# Patient Record
Sex: Female | Born: 1965 | Race: White | Hispanic: No | Marital: Married
Health system: Southern US, Community
[De-identification: ages and names within clinical notes are randomized; demographics above are authoritative.]

---

## 1998-02-19 ENCOUNTER — Inpatient Hospital Stay (HOSPITAL_COMMUNITY): Admission: AD | Admit: 1998-02-19 | Discharge: 1998-02-21 | Payer: Self-pay | Admitting: Obstetrics and Gynecology

## 2001-05-10 ENCOUNTER — Other Ambulatory Visit: Admission: RE | Admit: 2001-05-10 | Discharge: 2001-05-10 | Payer: Self-pay | Admitting: Obstetrics and Gynecology

## 2002-05-14 ENCOUNTER — Other Ambulatory Visit: Admission: RE | Admit: 2002-05-14 | Discharge: 2002-05-14 | Payer: Self-pay | Admitting: Obstetrics and Gynecology

## 2003-08-18 ENCOUNTER — Other Ambulatory Visit: Admission: RE | Admit: 2003-08-18 | Discharge: 2003-08-18 | Payer: Self-pay | Admitting: Obstetrics and Gynecology

## 2007-08-21 ENCOUNTER — Encounter: Admission: RE | Admit: 2007-08-21 | Discharge: 2007-08-21 | Payer: Self-pay | Admitting: Obstetrics and Gynecology

## 2008-09-16 ENCOUNTER — Encounter: Admission: RE | Admit: 2008-09-16 | Discharge: 2008-09-16 | Payer: Self-pay | Admitting: Obstetrics and Gynecology

## 2008-09-26 ENCOUNTER — Encounter: Admission: RE | Admit: 2008-09-26 | Discharge: 2008-09-26 | Payer: Self-pay | Admitting: Obstetrics and Gynecology

## 2009-11-27 ENCOUNTER — Encounter: Admission: RE | Admit: 2009-11-27 | Discharge: 2009-11-27 | Payer: Self-pay | Admitting: Obstetrics and Gynecology

## 2010-11-29 ENCOUNTER — Encounter
Admission: RE | Admit: 2010-11-29 | Discharge: 2010-11-29 | Payer: Self-pay | Source: Home / Self Care | Attending: Obstetrics and Gynecology | Admitting: Obstetrics and Gynecology

## 2010-12-05 ENCOUNTER — Encounter: Payer: Self-pay | Admitting: Obstetrics and Gynecology

## 2011-11-23 ENCOUNTER — Other Ambulatory Visit: Payer: Self-pay | Admitting: Obstetrics and Gynecology

## 2011-11-23 DIAGNOSIS — Z1231 Encounter for screening mammogram for malignant neoplasm of breast: Secondary | ICD-10-CM

## 2011-11-29 ENCOUNTER — Ambulatory Visit
Admission: RE | Admit: 2011-11-29 | Discharge: 2011-11-29 | Disposition: A | Discharge status: Home / Self Care | Payer: BC Managed Care – PPO | Source: Ambulatory Visit | Attending: Obstetrics and Gynecology | Admitting: Obstetrics and Gynecology

## 2011-11-29 DIAGNOSIS — Z1231 Encounter for screening mammogram for malignant neoplasm of breast: Secondary | ICD-10-CM

## 2012-10-31 ENCOUNTER — Other Ambulatory Visit: Payer: Self-pay | Admitting: Obstetrics and Gynecology

## 2012-10-31 DIAGNOSIS — Z1231 Encounter for screening mammogram for malignant neoplasm of breast: Secondary | ICD-10-CM

## 2012-11-29 ENCOUNTER — Ambulatory Visit
Admission: RE | Admit: 2012-11-29 | Discharge: 2012-11-29 | Disposition: A | Discharge status: Home / Self Care | Payer: BC Managed Care – PPO | Source: Ambulatory Visit | Attending: Obstetrics and Gynecology | Admitting: Obstetrics and Gynecology

## 2012-11-29 DIAGNOSIS — Z1231 Encounter for screening mammogram for malignant neoplasm of breast: Secondary | ICD-10-CM

## 2013-11-26 ENCOUNTER — Other Ambulatory Visit: Payer: Self-pay

## 2013-11-26 DIAGNOSIS — Z1231 Encounter for screening mammogram for malignant neoplasm of breast: Secondary | ICD-10-CM

## 2014-01-10 ENCOUNTER — Ambulatory Visit: Payer: BC Managed Care – PPO

## 2014-01-16 ENCOUNTER — Ambulatory Visit
Admission: RE | Admit: 2014-01-16 | Discharge: 2014-01-16 | Disposition: A | Discharge status: Home / Self Care | Payer: BC Managed Care – PPO | Source: Ambulatory Visit

## 2014-01-16 DIAGNOSIS — Z1231 Encounter for screening mammogram for malignant neoplasm of breast: Secondary | ICD-10-CM

## 2014-01-20 ENCOUNTER — Other Ambulatory Visit: Payer: Self-pay | Admitting: Obstetrics and Gynecology

## 2014-01-20 DIAGNOSIS — R928 Other abnormal and inconclusive findings on diagnostic imaging of breast: Secondary | ICD-10-CM

## 2014-01-31 ENCOUNTER — Ambulatory Visit
Admission: RE | Admit: 2014-01-31 | Discharge: 2014-01-31 | Disposition: A | Discharge status: Home / Self Care | Payer: BC Managed Care – PPO | Source: Ambulatory Visit | Attending: Obstetrics and Gynecology | Admitting: Obstetrics and Gynecology

## 2014-01-31 ENCOUNTER — Ambulatory Visit
Admission: RE | Admit: 2014-01-31 | Discharge: 2014-01-31 | Disposition: A | Discharge status: Home / Self Care | Payer: Self-pay | Source: Ambulatory Visit | Attending: Obstetrics and Gynecology | Admitting: Obstetrics and Gynecology

## 2014-01-31 DIAGNOSIS — R928 Other abnormal and inconclusive findings on diagnostic imaging of breast: Secondary | ICD-10-CM

## 2014-12-31 ENCOUNTER — Other Ambulatory Visit: Payer: Self-pay

## 2014-12-31 DIAGNOSIS — Z1231 Encounter for screening mammogram for malignant neoplasm of breast: Secondary | ICD-10-CM

## 2015-02-09 ENCOUNTER — Ambulatory Visit: Payer: Self-pay

## 2015-02-18 ENCOUNTER — Other Ambulatory Visit: Payer: Self-pay | Admitting: Family Medicine

## 2015-02-18 ENCOUNTER — Ambulatory Visit
Admission: RE | Admit: 2015-02-18 | Discharge: 2015-02-18 | Disposition: A | Discharge status: Home / Self Care | Payer: BLUE CROSS/BLUE SHIELD | Source: Ambulatory Visit | Attending: Family Medicine | Admitting: Family Medicine

## 2015-02-18 DIAGNOSIS — R05 Cough: Secondary | ICD-10-CM

## 2015-02-18 DIAGNOSIS — R059 Cough, unspecified: Secondary | ICD-10-CM

## 2015-04-20 ENCOUNTER — Ambulatory Visit
Admission: RE | Admit: 2015-04-20 | Discharge: 2015-04-20 | Disposition: A | Discharge status: Home / Self Care | Payer: BLUE CROSS/BLUE SHIELD | Source: Ambulatory Visit

## 2015-04-20 ENCOUNTER — Encounter (INDEPENDENT_AMBULATORY_CARE_PROVIDER_SITE_OTHER): Payer: Self-pay

## 2015-04-20 DIAGNOSIS — Z1231 Encounter for screening mammogram for malignant neoplasm of breast: Secondary | ICD-10-CM

## 2016-03-17 ENCOUNTER — Other Ambulatory Visit: Payer: Self-pay

## 2016-03-17 DIAGNOSIS — Z1231 Encounter for screening mammogram for malignant neoplasm of breast: Secondary | ICD-10-CM

## 2016-04-22 ENCOUNTER — Ambulatory Visit: Payer: BLUE CROSS/BLUE SHIELD

## 2016-05-09 ENCOUNTER — Ambulatory Visit
Admission: RE | Admit: 2016-05-09 | Discharge: 2016-05-09 | Disposition: A | Discharge status: Home / Self Care | Payer: BLUE CROSS/BLUE SHIELD | Source: Ambulatory Visit

## 2016-05-09 DIAGNOSIS — Z1231 Encounter for screening mammogram for malignant neoplasm of breast: Secondary | ICD-10-CM

## 2016-07-27 DIAGNOSIS — Z6822 Body mass index (BMI) 22.0-22.9, adult: Secondary | ICD-10-CM | POA: Diagnosis not present

## 2016-07-27 DIAGNOSIS — Z01419 Encounter for gynecological examination (general) (routine) without abnormal findings: Secondary | ICD-10-CM | POA: Diagnosis not present

## 2017-03-15 DIAGNOSIS — F4324 Adjustment disorder with disturbance of conduct: Secondary | ICD-10-CM | POA: Diagnosis not present

## 2017-03-24 DIAGNOSIS — F4324 Adjustment disorder with disturbance of conduct: Secondary | ICD-10-CM | POA: Diagnosis not present

## 2017-04-19 ENCOUNTER — Other Ambulatory Visit: Payer: Self-pay | Admitting: Obstetrics and Gynecology

## 2017-04-19 DIAGNOSIS — Z1231 Encounter for screening mammogram for malignant neoplasm of breast: Secondary | ICD-10-CM

## 2017-04-21 DIAGNOSIS — L237 Allergic contact dermatitis due to plants, except food: Secondary | ICD-10-CM | POA: Diagnosis not present

## 2017-04-21 DIAGNOSIS — L03113 Cellulitis of right upper limb: Secondary | ICD-10-CM | POA: Diagnosis not present

## 2017-05-11 ENCOUNTER — Ambulatory Visit
Admission: RE | Admit: 2017-05-11 | Discharge: 2017-05-11 | Disposition: A | Discharge status: Home / Self Care | Payer: BLUE CROSS/BLUE SHIELD | Source: Ambulatory Visit | Attending: Obstetrics and Gynecology | Admitting: Obstetrics and Gynecology

## 2017-05-11 DIAGNOSIS — Z1231 Encounter for screening mammogram for malignant neoplasm of breast: Secondary | ICD-10-CM

## 2017-05-22 DIAGNOSIS — F4323 Adjustment disorder with mixed anxiety and depressed mood: Secondary | ICD-10-CM | POA: Diagnosis not present

## 2017-05-29 DIAGNOSIS — F4323 Adjustment disorder with mixed anxiety and depressed mood: Secondary | ICD-10-CM | POA: Diagnosis not present

## 2017-06-14 DIAGNOSIS — F4323 Adjustment disorder with mixed anxiety and depressed mood: Secondary | ICD-10-CM | POA: Diagnosis not present

## 2017-07-19 DIAGNOSIS — F4323 Adjustment disorder with mixed anxiety and depressed mood: Secondary | ICD-10-CM | POA: Diagnosis not present

## 2017-07-24 DIAGNOSIS — Z86018 Personal history of other benign neoplasm: Secondary | ICD-10-CM | POA: Diagnosis not present

## 2017-07-24 DIAGNOSIS — D225 Melanocytic nevi of trunk: Secondary | ICD-10-CM | POA: Diagnosis not present

## 2017-07-24 DIAGNOSIS — L814 Other melanin hyperpigmentation: Secondary | ICD-10-CM | POA: Diagnosis not present

## 2017-07-24 DIAGNOSIS — D485 Neoplasm of uncertain behavior of skin: Secondary | ICD-10-CM | POA: Diagnosis not present

## 2017-07-24 DIAGNOSIS — D1800 Hemangioma unspecified site: Secondary | ICD-10-CM | POA: Diagnosis not present

## 2017-10-18 DIAGNOSIS — Z6823 Body mass index (BMI) 23.0-23.9, adult: Secondary | ICD-10-CM | POA: Diagnosis not present

## 2017-10-18 DIAGNOSIS — Z01419 Encounter for gynecological examination (general) (routine) without abnormal findings: Secondary | ICD-10-CM | POA: Diagnosis not present

## 2017-10-31 DIAGNOSIS — Z1159 Encounter for screening for other viral diseases: Secondary | ICD-10-CM | POA: Diagnosis not present

## 2017-10-31 DIAGNOSIS — Z113 Encounter for screening for infections with a predominantly sexual mode of transmission: Secondary | ICD-10-CM | POA: Diagnosis not present

## 2017-10-31 DIAGNOSIS — Z118 Encounter for screening for other infectious and parasitic diseases: Secondary | ICD-10-CM | POA: Diagnosis not present

## 2017-10-31 DIAGNOSIS — R87612 Low grade squamous intraepithelial lesion on cytologic smear of cervix (LGSIL): Secondary | ICD-10-CM | POA: Diagnosis not present

## 2017-10-31 DIAGNOSIS — Z114 Encounter for screening for human immunodeficiency virus [HIV]: Secondary | ICD-10-CM | POA: Diagnosis not present

## 2017-11-29 DIAGNOSIS — F4322 Adjustment disorder with anxiety: Secondary | ICD-10-CM | POA: Diagnosis not present

## 2017-12-27 DIAGNOSIS — F4322 Adjustment disorder with anxiety: Secondary | ICD-10-CM | POA: Diagnosis not present

## 2018-02-02 DIAGNOSIS — F4322 Adjustment disorder with anxiety: Secondary | ICD-10-CM | POA: Diagnosis not present

## 2018-04-08 DIAGNOSIS — L237 Allergic contact dermatitis due to plants, except food: Secondary | ICD-10-CM | POA: Diagnosis not present

## 2018-04-16 ENCOUNTER — Other Ambulatory Visit: Payer: Self-pay | Admitting: Obstetrics and Gynecology

## 2018-04-16 DIAGNOSIS — Z1231 Encounter for screening mammogram for malignant neoplasm of breast: Secondary | ICD-10-CM

## 2018-04-30 DIAGNOSIS — Z0000 Encounter for general adult medical examination without abnormal findings: Secondary | ICD-10-CM | POA: Diagnosis not present

## 2018-04-30 DIAGNOSIS — Z23 Encounter for immunization: Secondary | ICD-10-CM | POA: Diagnosis not present

## 2018-04-30 DIAGNOSIS — Z136 Encounter for screening for cardiovascular disorders: Secondary | ICD-10-CM | POA: Diagnosis not present

## 2018-05-16 ENCOUNTER — Ambulatory Visit: Payer: BLUE CROSS/BLUE SHIELD

## 2018-06-21 ENCOUNTER — Ambulatory Visit
Admission: RE | Admit: 2018-06-21 | Discharge: 2018-06-21 | Disposition: A | Discharge status: Home / Self Care | Payer: BLUE CROSS/BLUE SHIELD | Source: Ambulatory Visit | Attending: Obstetrics and Gynecology | Admitting: Obstetrics and Gynecology

## 2018-06-21 DIAGNOSIS — Z1231 Encounter for screening mammogram for malignant neoplasm of breast: Secondary | ICD-10-CM | POA: Diagnosis not present

## 2018-06-27 DIAGNOSIS — K635 Polyp of colon: Secondary | ICD-10-CM | POA: Diagnosis not present

## 2018-06-27 DIAGNOSIS — Z1211 Encounter for screening for malignant neoplasm of colon: Secondary | ICD-10-CM | POA: Diagnosis not present

## 2018-06-27 DIAGNOSIS — K5289 Other specified noninfective gastroenteritis and colitis: Secondary | ICD-10-CM | POA: Diagnosis not present

## 2018-07-03 DIAGNOSIS — Z1211 Encounter for screening for malignant neoplasm of colon: Secondary | ICD-10-CM | POA: Diagnosis not present

## 2018-07-03 DIAGNOSIS — K635 Polyp of colon: Secondary | ICD-10-CM | POA: Diagnosis not present

## 2018-07-03 DIAGNOSIS — K5289 Other specified noninfective gastroenteritis and colitis: Secondary | ICD-10-CM | POA: Diagnosis not present

## 2018-07-25 DIAGNOSIS — D1800 Hemangioma unspecified site: Secondary | ICD-10-CM | POA: Diagnosis not present

## 2018-07-25 DIAGNOSIS — L821 Other seborrheic keratosis: Secondary | ICD-10-CM | POA: Diagnosis not present

## 2018-07-25 DIAGNOSIS — Z86018 Personal history of other benign neoplasm: Secondary | ICD-10-CM | POA: Diagnosis not present

## 2018-07-25 DIAGNOSIS — L814 Other melanin hyperpigmentation: Secondary | ICD-10-CM | POA: Diagnosis not present

## 2018-09-12 DIAGNOSIS — B078 Other viral warts: Secondary | ICD-10-CM | POA: Diagnosis not present

## 2018-12-26 DIAGNOSIS — B079 Viral wart, unspecified: Secondary | ICD-10-CM | POA: Diagnosis not present

## 2018-12-26 DIAGNOSIS — L821 Other seborrheic keratosis: Secondary | ICD-10-CM | POA: Diagnosis not present

## 2018-12-26 DIAGNOSIS — D485 Neoplasm of uncertain behavior of skin: Secondary | ICD-10-CM | POA: Diagnosis not present

## 2019-01-16 DIAGNOSIS — Z124 Encounter for screening for malignant neoplasm of cervix: Secondary | ICD-10-CM | POA: Diagnosis not present

## 2019-01-16 DIAGNOSIS — Z01419 Encounter for gynecological examination (general) (routine) without abnormal findings: Secondary | ICD-10-CM | POA: Diagnosis not present

## 2019-01-16 DIAGNOSIS — Z113 Encounter for screening for infections with a predominantly sexual mode of transmission: Secondary | ICD-10-CM | POA: Diagnosis not present

## 2019-01-16 DIAGNOSIS — Z1151 Encounter for screening for human papillomavirus (HPV): Secondary | ICD-10-CM | POA: Diagnosis not present

## 2019-01-16 DIAGNOSIS — Z6823 Body mass index (BMI) 23.0-23.9, adult: Secondary | ICD-10-CM | POA: Diagnosis not present

## 2019-01-16 DIAGNOSIS — R87810 Cervical high risk human papillomavirus (HPV) DNA test positive: Secondary | ICD-10-CM | POA: Diagnosis not present

## 2019-05-15 ENCOUNTER — Other Ambulatory Visit: Payer: Self-pay | Admitting: Obstetrics and Gynecology

## 2019-05-15 DIAGNOSIS — Z1231 Encounter for screening mammogram for malignant neoplasm of breast: Secondary | ICD-10-CM

## 2019-06-25 ENCOUNTER — Ambulatory Visit
Admission: RE | Admit: 2019-06-25 | Discharge: 2019-06-25 | Disposition: A | Discharge status: Home / Self Care | Payer: BLUE CROSS/BLUE SHIELD | Source: Ambulatory Visit | Attending: Obstetrics and Gynecology | Admitting: Obstetrics and Gynecology

## 2019-06-25 ENCOUNTER — Other Ambulatory Visit: Payer: Self-pay

## 2019-06-25 DIAGNOSIS — Z1231 Encounter for screening mammogram for malignant neoplasm of breast: Secondary | ICD-10-CM

## 2020-01-09 ENCOUNTER — Ambulatory Visit: Payer: Self-pay | Attending: Internal Medicine

## 2020-01-09 DIAGNOSIS — Z23 Encounter for immunization: Secondary | ICD-10-CM | POA: Insufficient documentation

## 2020-01-09 NOTE — Progress Notes (Signed)
   Covid-19 Vaccination Clinic  Name:  Alexandra Rivera    MRN: [id number] DOB: 11-30-1965  01/09/2020  [name] was observed post Covid-19 immunization for 15 minutes without incidence. She was provided with Vaccine Information Sheet and instruction to access the V-Safe system.   [name] was instructed to call 911 with any severe reactions post vaccine: Marland Kitchen Difficulty breathing  . Swelling of your face and throat  . A fast heartbeat  . A bad rash all over your body  . Dizziness and weakness    Immunizations Administered    Name Date Dose VIS Date Route   Pfizer COVID-19 Vaccine 01/09/2020 11:30 AM 0.3 mL 10/25/2019 Intramuscular   Manufacturer: [employer], [employer]   Lot: [id number]   NDC: [address]-8

## 2020-01-29 ENCOUNTER — Ambulatory Visit: Payer: Self-pay

## 2020-02-04 ENCOUNTER — Ambulatory Visit: Payer: Self-pay | Attending: Internal Medicine

## 2020-02-04 DIAGNOSIS — Z23 Encounter for immunization: Secondary | ICD-10-CM

## 2020-02-04 NOTE — Progress Notes (Signed)
   Covid-19 Vaccination Clinic  Name:  Alexandra Rivera    MRN: [id number] DOB: 03-21-1966  02/04/2020  [name] was observed post Covid-19 immunization for 15 minutes without incident. She was provided with Vaccine Information Sheet and instruction to access the V-Safe system.   [name] was instructed to call 911 with any severe reactions post vaccine: Marland Kitchen Difficulty breathing  . Swelling of face and throat  . A fast heartbeat  . A bad rash all over body  . Dizziness and weakness   Immunizations Administered    Name Date Dose VIS Date Route   Pfizer COVID-19 Vaccine 02/04/2020  2:46 PM 0.3 mL 10/25/2019 Intramuscular   Manufacturer: [employer], [employer]   Lot: [id number]   NDC: [address]-0

## 2020-05-15 ENCOUNTER — Other Ambulatory Visit: Payer: Self-pay | Admitting: Obstetrics and Gynecology

## 2020-05-15 DIAGNOSIS — Z1231 Encounter for screening mammogram for malignant neoplasm of breast: Secondary | ICD-10-CM

## 2020-06-25 ENCOUNTER — Ambulatory Visit
Admission: RE | Admit: 2020-06-25 | Discharge: 2020-06-25 | Disposition: A | Discharge status: Home / Self Care | Payer: 59 | Source: Ambulatory Visit | Attending: Obstetrics and Gynecology | Admitting: Obstetrics and Gynecology

## 2020-06-25 ENCOUNTER — Other Ambulatory Visit: Payer: Self-pay

## 2020-06-25 DIAGNOSIS — Z1231 Encounter for screening mammogram for malignant neoplasm of breast: Secondary | ICD-10-CM

## 2020-06-30 IMAGING — MG DIGITAL SCREENING BILATERAL MAMMOGRAM WITH TOMO AND CAD
8 series · 9 of 24 positions shown · non-contrast
Comparison: Previous exam(s).

CLINICAL DATA: Screening.

EXAM:
DIGITAL SCREENING BILATERAL MAMMOGRAM WITH TOMO AND CAD

[R MLO synth-2D]
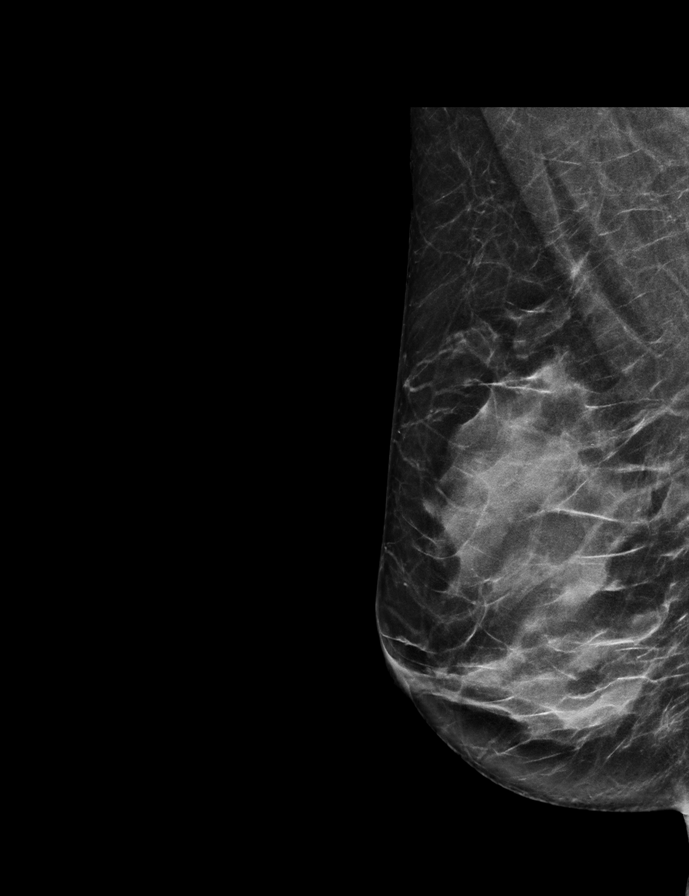

[R CC synth-2D]
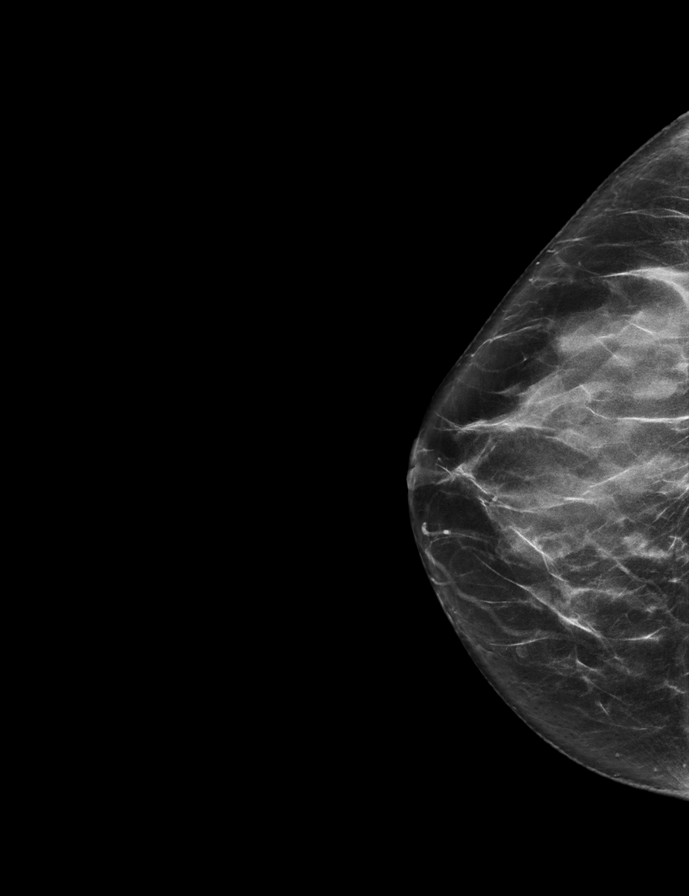

[L CC synth-2D]
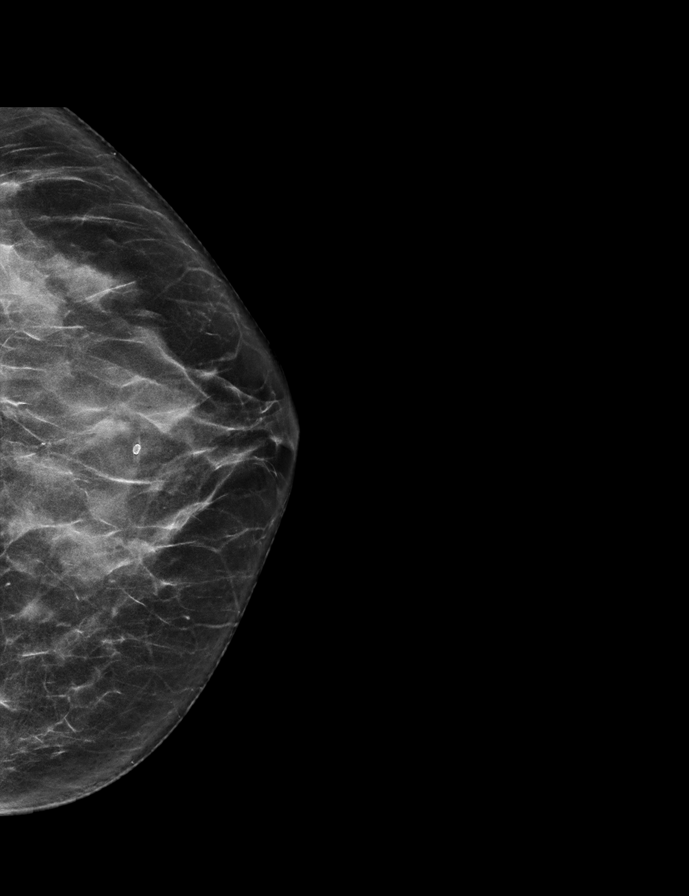

[L MLO synth-2D]
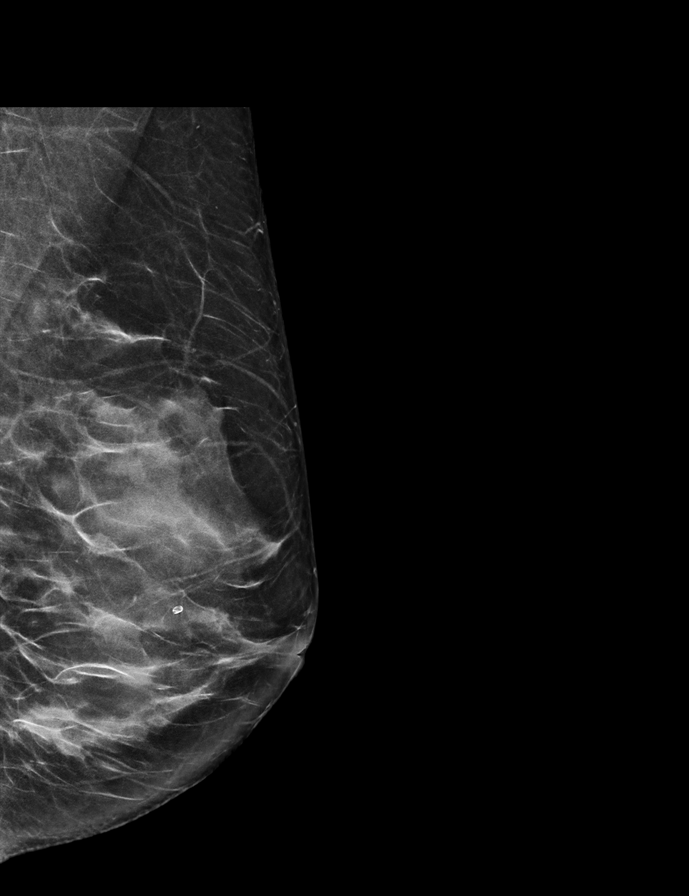

[L MLO tomo · 2 of 61 frames shown]
[frame 20/61]
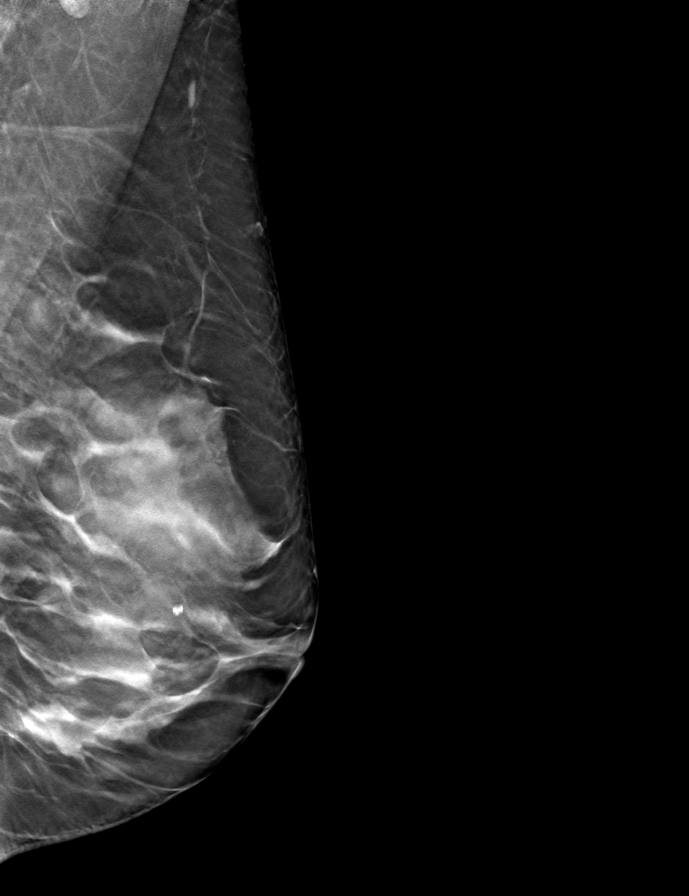
[frame 31/61]
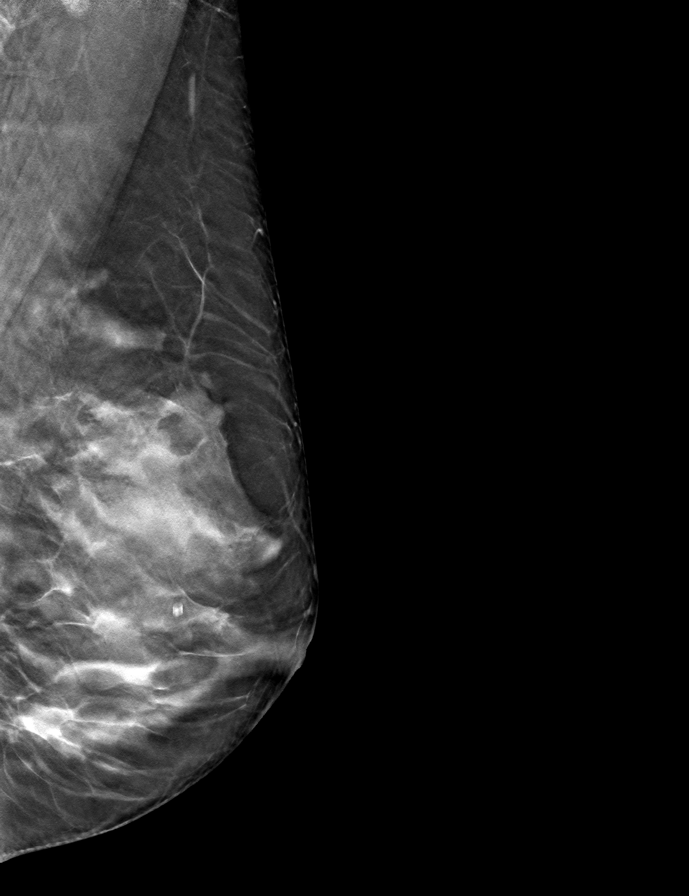

[L CC tomo · tomo slice 30/59.0]
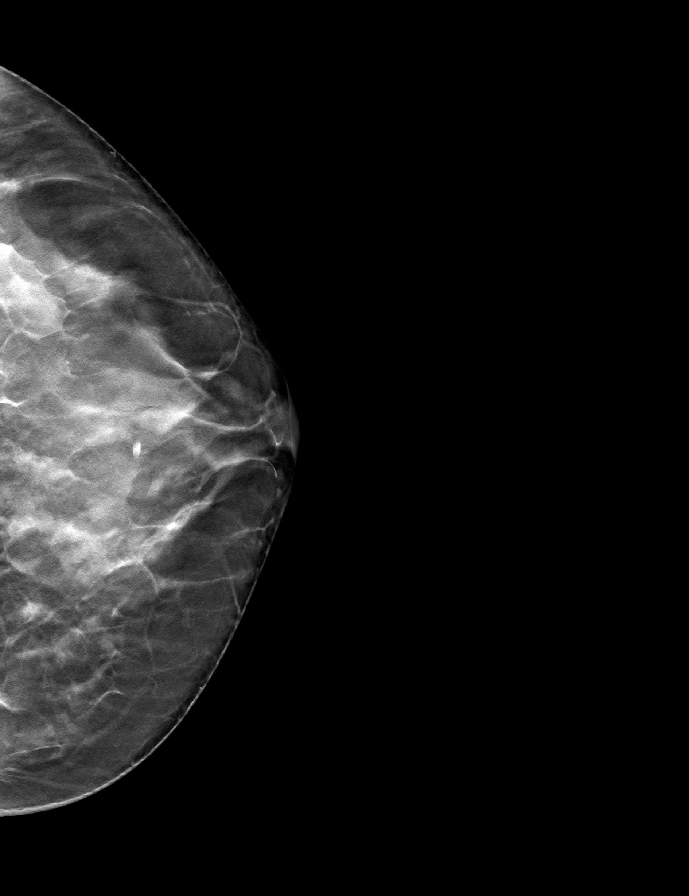

[R MLO tomo · tomo slice 31/61.0]
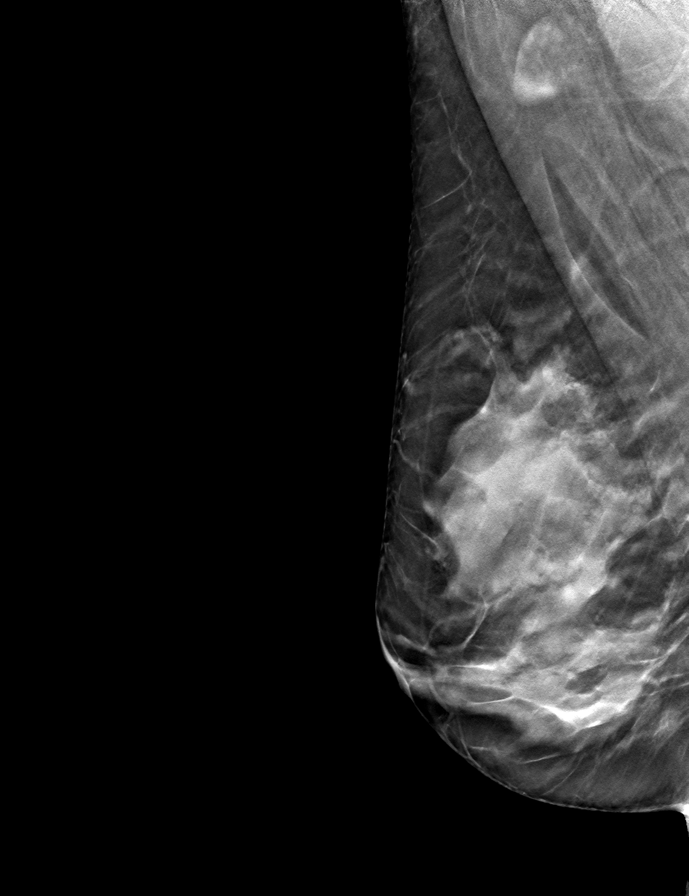

[R CC tomo · tomo slice 31/60.0]
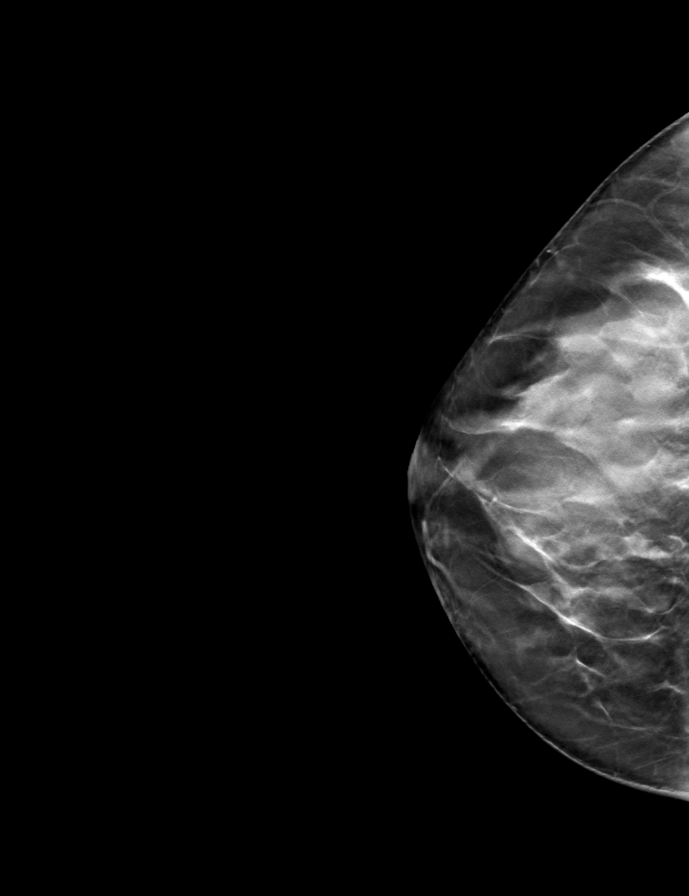

[9 of 24 positions shown; findings below may reference images not displayed]

ACR Breast Density Category d: The breast tissue is extremely dense,
which lowers the sensitivity of mammography
FINDINGS: There are no findings suspicious for malignancy. Images were
processed with CAD.
IMPRESSION: No mammographic evidence of malignancy. A result letter of this
screening mammogram will be mailed directly to the patient.

RECOMMENDATION:
Screening mammogram in one year. (Code:WO-0-ZI0)

BI-RADS CATEGORY  1: Negative.

## 2020-11-28 ENCOUNTER — Other Ambulatory Visit: Payer: 59

## 2020-11-28 DIAGNOSIS — Z20822 Contact with and (suspected) exposure to covid-19: Secondary | ICD-10-CM

## 2020-12-01 LAB — NOVEL CORONAVIRUS, NAA: SARS-CoV-2, NAA: NOT DETECTED

## 2021-05-31 ENCOUNTER — Other Ambulatory Visit: Payer: Self-pay | Admitting: Obstetrics and Gynecology

## 2021-05-31 DIAGNOSIS — Z1231 Encounter for screening mammogram for malignant neoplasm of breast: Secondary | ICD-10-CM

## 2021-07-23 ENCOUNTER — Ambulatory Visit: Payer: 59

## 2021-07-28 ENCOUNTER — Ambulatory Visit
Admission: RE | Admit: 2021-07-28 | Discharge: 2021-07-28 | Disposition: A | Discharge status: Home / Self Care | Payer: 59 | Source: Ambulatory Visit | Attending: Obstetrics and Gynecology | Admitting: Obstetrics and Gynecology

## 2021-07-28 ENCOUNTER — Other Ambulatory Visit: Payer: Self-pay

## 2021-07-28 DIAGNOSIS — Z1231 Encounter for screening mammogram for malignant neoplasm of breast: Secondary | ICD-10-CM

## 2022-06-14 ENCOUNTER — Other Ambulatory Visit: Payer: Self-pay | Admitting: Obstetrics and Gynecology

## 2022-06-14 DIAGNOSIS — Z1231 Encounter for screening mammogram for malignant neoplasm of breast: Secondary | ICD-10-CM

## 2022-08-01 ENCOUNTER — Ambulatory Visit: Payer: 59

## 2022-08-17 ENCOUNTER — Ambulatory Visit
Admission: RE | Admit: 2022-08-17 | Discharge: 2022-08-17 | Disposition: A | Discharge status: Home / Self Care | Payer: 59 | Source: Ambulatory Visit | Attending: Obstetrics and Gynecology | Admitting: Obstetrics and Gynecology

## 2022-08-17 DIAGNOSIS — Z1231 Encounter for screening mammogram for malignant neoplasm of breast: Secondary | ICD-10-CM

## 2023-02-02 ENCOUNTER — Ambulatory Visit: Payer: 59 | Admitting: Podiatry

## 2023-02-02 DIAGNOSIS — M205X2 Other deformities of toe(s) (acquired), left foot: Secondary | ICD-10-CM | POA: Diagnosis not present

## 2023-02-02 DIAGNOSIS — M7752 Other enthesopathy of left foot: Secondary | ICD-10-CM

## 2023-02-02 NOTE — Progress Notes (Signed)
  Subjective:  Patient ID: Alexandra Rivera, female    DOB: 23-Mar-1966,  MRN: [id number]  Chief Complaint  Patient presents with   Foot Pain    Pt stated that she has been having some issues with her feet and she just wanted to have them looked at     57 y.o. female presents with the above complaint.  Patient presents with left hallux limitus.  Patient states that she has been having this issue for a while.  Hurts with ambulation worse with pressure she states that pain scale 7 out of 10 dull achy in nature.  She states nothing has helped.  She states it is progressive gotten worse.  She would like to discuss treatment options for it.  Hurts with ambulation worse with pressure   Review of Systems: Negative except as noted in the HPI. Denies N/V/F/Ch.  No past medical history on file. No current outpatient medications on file.  Social History   Tobacco Use  Smoking Status Not on file  Smokeless Tobacco Not on file    No Known Allergies Objective:  There were no vitals filed for this visit. There is no height or weight on file to calculate BMI. Constitutional Well developed. Well nourished.  Vascular Dorsalis pedis pulses palpable bilaterally. Posterior tibial pulses palpable bilaterally. Capillary refill normal to all digits.  No cyanosis or clubbing noted. Pedal hair growth normal.  Neurologic Normal speech. Oriented to person, place, and time. Epicritic sensation to light touch grossly present bilaterally.  Dermatologic Nails well groomed and normal in appearance. No open wounds. No skin lesions.  Orthopedic: Pain with range of motion to the left first metatarsophalangeal joint pain with range of motion of the joint.  Limited range of motion noted.  Underlying crepitus noted.   Radiographs: None Assessment:   1. Hallux limitus of left foot   2. Capsulitis of metatarsophalangeal (MTP) joint of left foot    Plan:  Patient was evaluated and treated and all questions  answered.  Left hallux limitus with underlying arthritis -All questions and concerns were discussed with the patient in extensive detail given the amount of pain that she is having she will benefit from a steroid injection of decrease inflammatory component associate with pain.  Patient agrees with plan like to proceed with steroid injection. -A steroid injection was performed at left first metatarsophalangeal joint using 1% plain Lidocaine and 10 mg of Kenalog. This was well tolerated.   No follow-ups on file.   Left hallux limitus with underlying arthritis injection

## 2023-03-14 ENCOUNTER — Telehealth: Payer: Self-pay | Admitting: Podiatry

## 2023-03-14 NOTE — Telephone Encounter (Signed)
Pt left message today at 1200pm to cxl her appt for 5.2.  I returned call and left message for pt that I got her message an appt is canceled and to call to reschedule

## 2023-03-16 ENCOUNTER — Ambulatory Visit: Payer: 59 | Admitting: Podiatry

## 2023-07-04 ENCOUNTER — Other Ambulatory Visit: Payer: Self-pay | Admitting: Obstetrics and Gynecology

## 2023-07-04 DIAGNOSIS — Z1231 Encounter for screening mammogram for malignant neoplasm of breast: Secondary | ICD-10-CM

## 2023-08-21 ENCOUNTER — Ambulatory Visit: Payer: 59

## 2023-10-31 DIAGNOSIS — Z01419 Encounter for gynecological examination (general) (routine) without abnormal findings: Secondary | ICD-10-CM | POA: Diagnosis not present

## 2023-10-31 DIAGNOSIS — Z1231 Encounter for screening mammogram for malignant neoplasm of breast: Secondary | ICD-10-CM | POA: Diagnosis not present

## 2023-10-31 DIAGNOSIS — Z124 Encounter for screening for malignant neoplasm of cervix: Secondary | ICD-10-CM | POA: Diagnosis not present

## 2023-10-31 DIAGNOSIS — Z1331 Encounter for screening for depression: Secondary | ICD-10-CM | POA: Diagnosis not present

## 2023-11-01 ENCOUNTER — Other Ambulatory Visit: Payer: Self-pay | Admitting: Obstetrics and Gynecology

## 2023-11-01 DIAGNOSIS — E2839 Other primary ovarian failure: Secondary | ICD-10-CM

## 2023-11-01 DIAGNOSIS — N951 Menopausal and female climacteric states: Secondary | ICD-10-CM

## 2023-11-22 DIAGNOSIS — E785 Hyperlipidemia, unspecified: Secondary | ICD-10-CM | POA: Diagnosis not present

## 2023-11-22 DIAGNOSIS — Z0000 Encounter for general adult medical examination without abnormal findings: Secondary | ICD-10-CM | POA: Diagnosis not present

## 2023-11-22 DIAGNOSIS — Z23 Encounter for immunization: Secondary | ICD-10-CM | POA: Diagnosis not present

## 2023-11-24 DIAGNOSIS — Z1211 Encounter for screening for malignant neoplasm of colon: Secondary | ICD-10-CM | POA: Diagnosis not present

## 2024-02-20 DIAGNOSIS — E785 Hyperlipidemia, unspecified: Secondary | ICD-10-CM | POA: Diagnosis not present

## 2024-05-09 DIAGNOSIS — E785 Hyperlipidemia, unspecified: Secondary | ICD-10-CM | POA: Diagnosis not present

## 2024-05-09 DIAGNOSIS — Z79899 Other long term (current) drug therapy: Secondary | ICD-10-CM | POA: Diagnosis not present

## 2024-07-03 DIAGNOSIS — L821 Other seborrheic keratosis: Secondary | ICD-10-CM | POA: Diagnosis not present

## 2024-07-03 DIAGNOSIS — L814 Other melanin hyperpigmentation: Secondary | ICD-10-CM | POA: Diagnosis not present

## 2024-07-03 DIAGNOSIS — D225 Melanocytic nevi of trunk: Secondary | ICD-10-CM | POA: Diagnosis not present

## 2024-07-03 DIAGNOSIS — Z86018 Personal history of other benign neoplasm: Secondary | ICD-10-CM | POA: Diagnosis not present

## 2024-07-03 DIAGNOSIS — C44311 Basal cell carcinoma of skin of nose: Secondary | ICD-10-CM | POA: Diagnosis not present

## 2024-07-03 DIAGNOSIS — D485 Neoplasm of uncertain behavior of skin: Secondary | ICD-10-CM | POA: Diagnosis not present

## 2024-07-08 ENCOUNTER — Other Ambulatory Visit: Payer: Self-pay

## 2024-07-08 ENCOUNTER — Ambulatory Visit (HOSPITAL_BASED_OUTPATIENT_CLINIC_OR_DEPARTMENT_OTHER)
Admission: RE | Admit: 2024-07-08 | Discharge: 2024-07-08 | Disposition: A | Discharge status: Home / Self Care | Payer: Self-pay | Source: Ambulatory Visit | Attending: Obstetrics and Gynecology | Admitting: Obstetrics and Gynecology

## 2024-07-08 DIAGNOSIS — N951 Menopausal and female climacteric states: Secondary | ICD-10-CM | POA: Diagnosis not present

## 2024-07-08 DIAGNOSIS — Z780 Asymptomatic menopausal state: Secondary | ICD-10-CM | POA: Diagnosis not present

## 2024-07-08 DIAGNOSIS — M8589 Other specified disorders of bone density and structure, multiple sites: Secondary | ICD-10-CM | POA: Diagnosis not present

## 2024-07-08 DIAGNOSIS — E2839 Other primary ovarian failure: Secondary | ICD-10-CM | POA: Diagnosis not present
